# Patient Record
Sex: Female | Born: 1970 | Race: White | Hispanic: No | Marital: Married | Smoking: Former smoker
Health system: Southern US, Community
[De-identification: ages and names within clinical notes are randomized; demographics above are authoritative.]

## PROBLEM LIST (undated history)

## (undated) DIAGNOSIS — K219 Gastro-esophageal reflux disease without esophagitis: Secondary | ICD-10-CM

## (undated) DIAGNOSIS — G43909 Migraine, unspecified, not intractable, without status migrainosus: Secondary | ICD-10-CM

## (undated) HISTORY — DX: Gastro-esophageal reflux disease without esophagitis: K21.9

## (undated) HISTORY — DX: Migraine, unspecified, not intractable, without status migrainosus: G43.909

---

## 2001-11-28 HISTORY — PX: TUBAL LIGATION: SHX77

## 2007-11-19 ENCOUNTER — Ambulatory Visit: Payer: Self-pay

## 2010-03-05 ENCOUNTER — Ambulatory Visit: Payer: Self-pay | Admitting: Obstetrics and Gynecology

## 2010-03-07 ENCOUNTER — Ambulatory Visit: Payer: Self-pay | Admitting: Obstetrics and Gynecology

## 2010-12-10 IMAGING — US ULTRASOUND LEFT BREAST
1 series · 17 of 25 positions shown · non-contrast
Comparison: none

REASON FOR EXAM: SPICULATED DENSITY
COMMENTS:

[Series 1: ultrasound left breast · 17 of 40 slices shown]
[im 1/40]
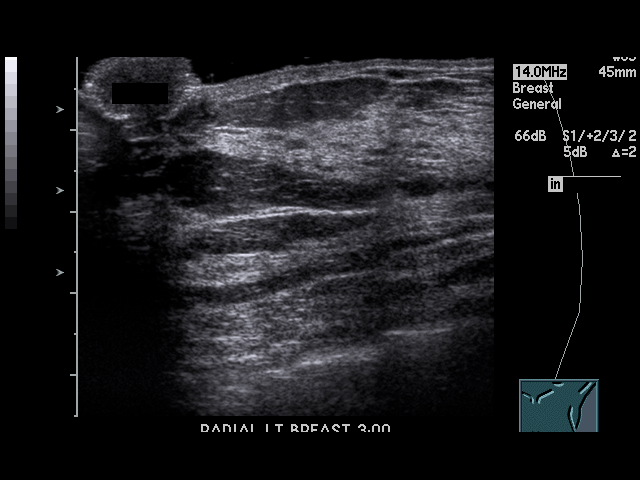
[im 4/40]
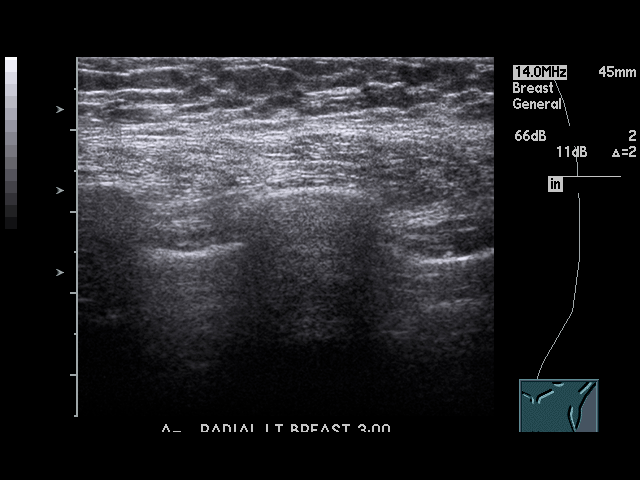
[im 5/40]
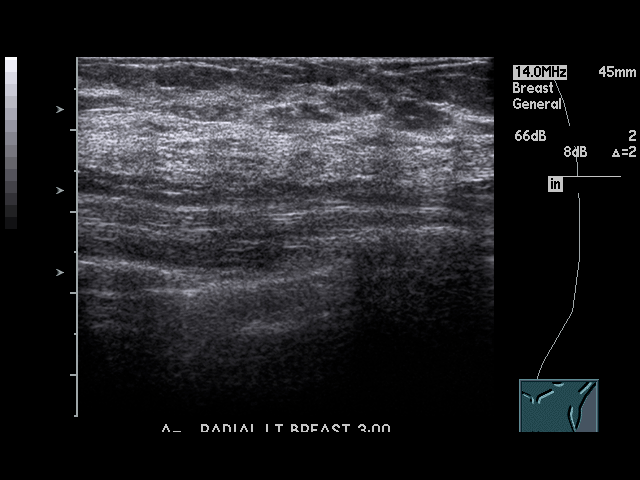
[im 9/40]
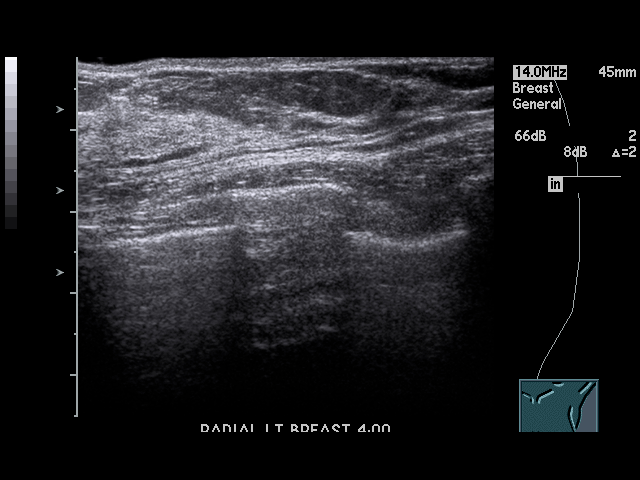
[im 10/40]
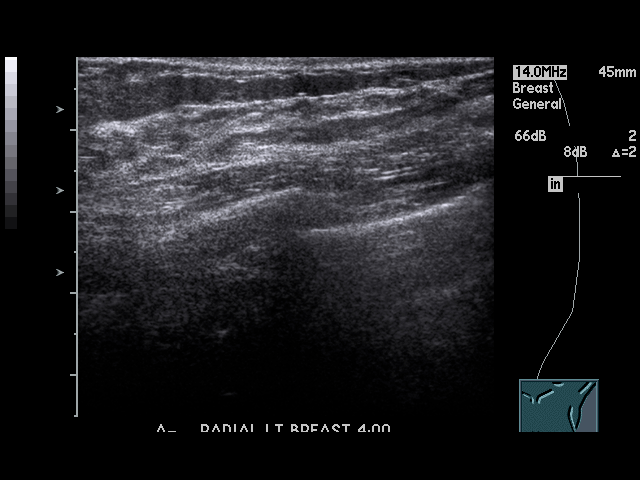
[im 14/40]
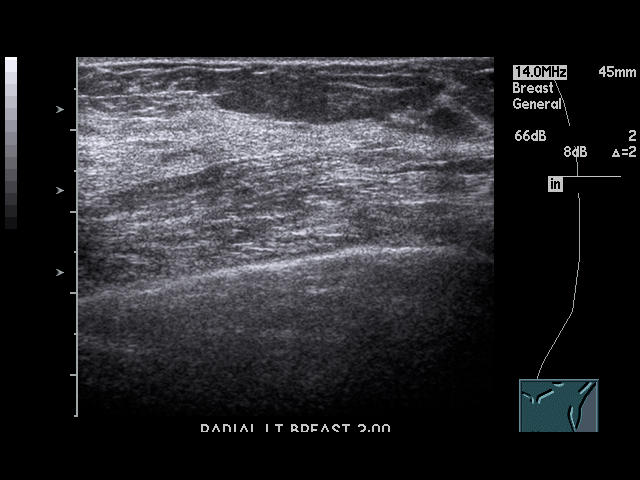
[im 15/40]
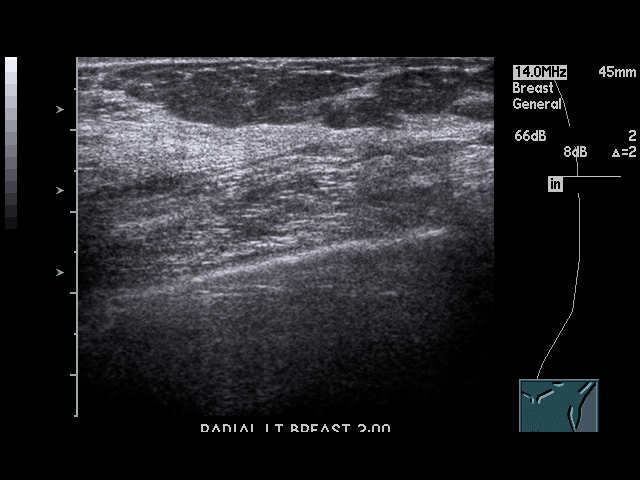
[im 18/40]
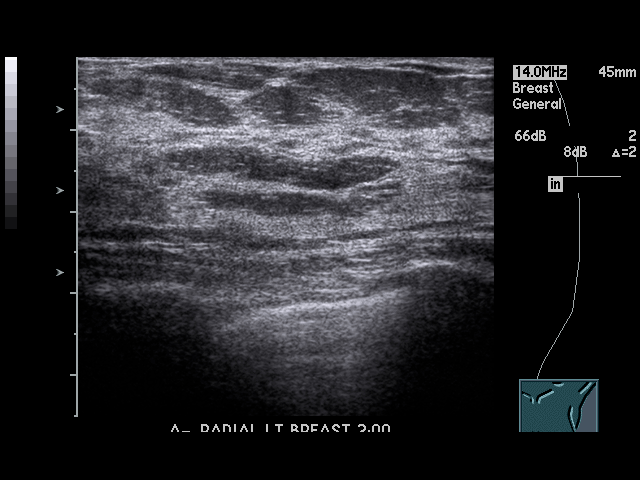
[im 20/40]
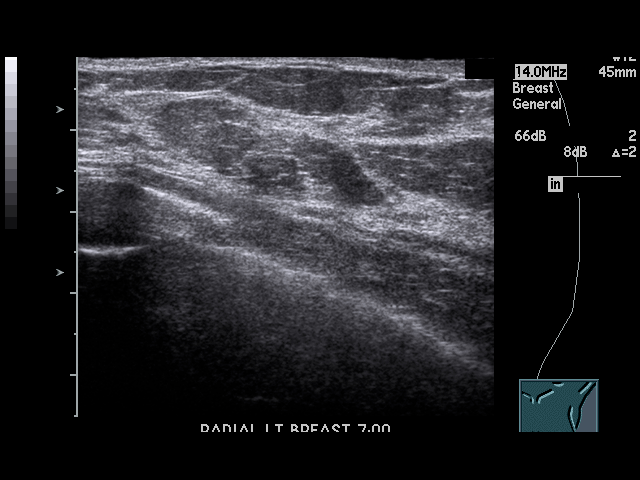
[im 22/40]
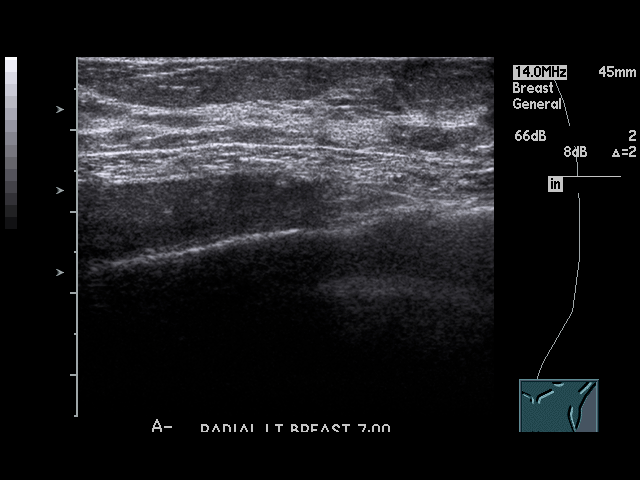
[im 25/40]
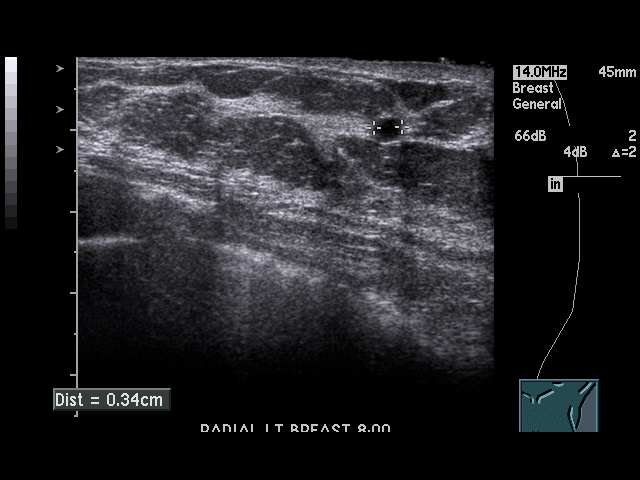
[im 27/40]
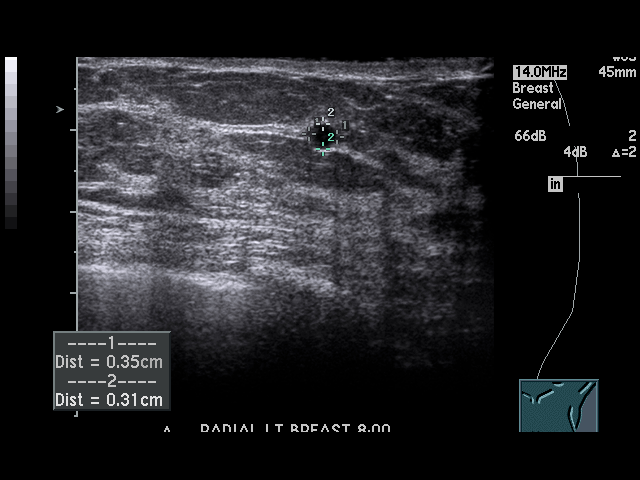
[im 30/40]
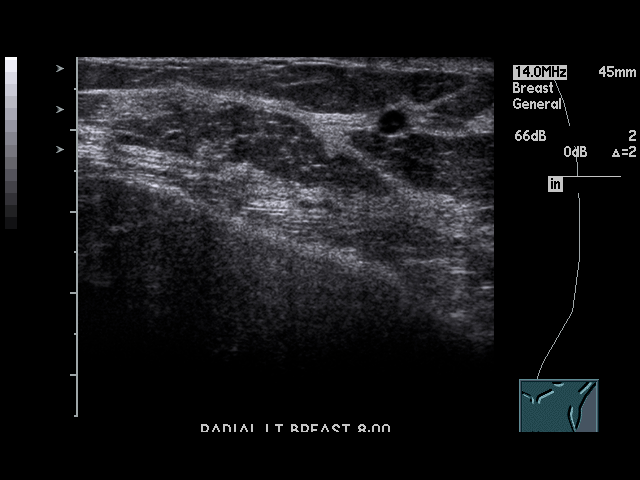
[im 31/40]
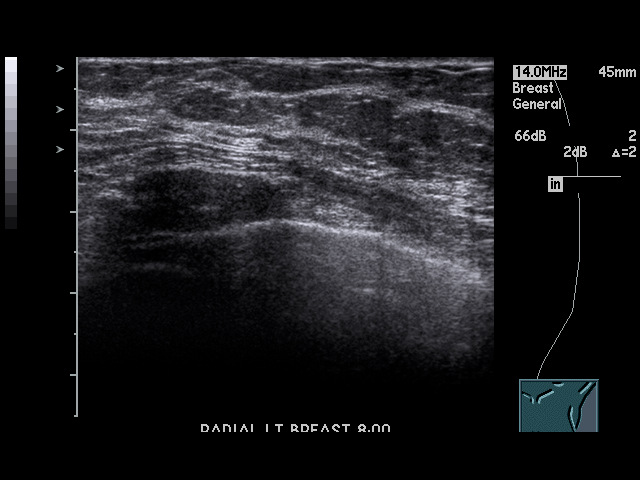
[im 35/40]
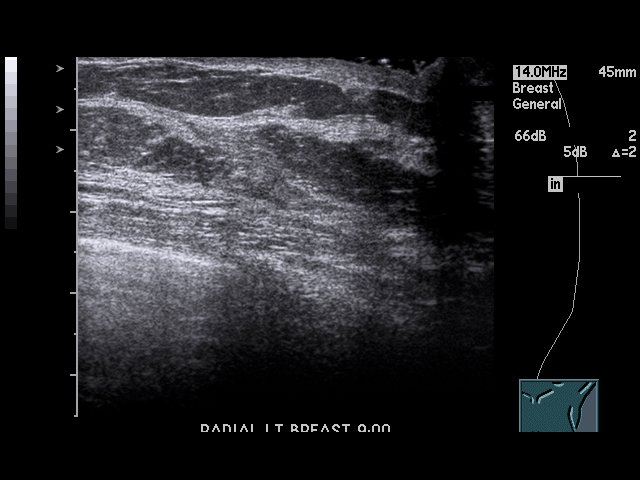
[im 36/40]
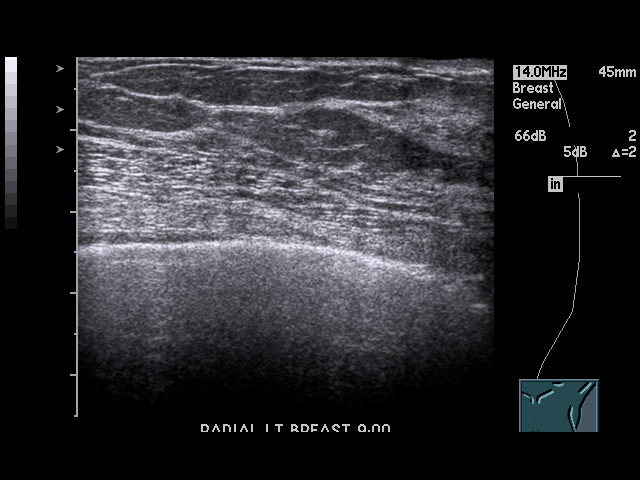
[im 40/40]
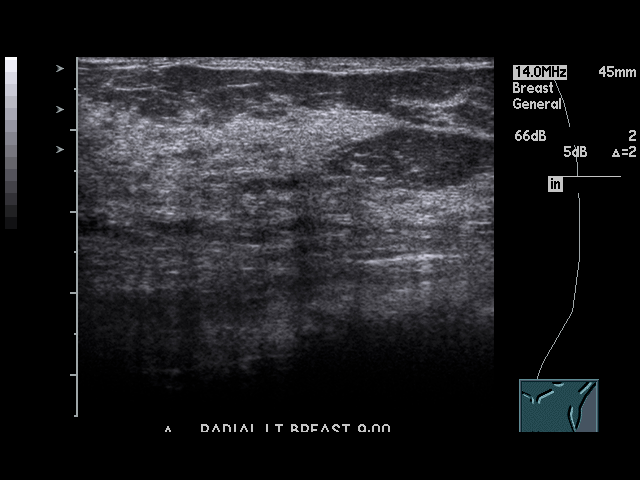

[17 of 25 positions shown; findings below may reference images not displayed]

PROCEDURE:     US  - US BREAST LEFT  - March 07, 2010  [DATE]

RESULT:     The left breast was evaluated from the 3 o'clock to the 8
o'clock position.

At the 8 o'clock position a small, benign appearing cyst is identified
measuring 3.5 x 3.1 x 3.4 mm demonstrated increased through transmission and
an imperceptible wall.  No further solid or cystic sonographic abnormality
is identified.
IMPRESSION: 1.Benign cyst at the 8 o'clock position as described above. Please refer to
the additional radiographic view dictation for completed discussion.

## 2010-12-10 IMAGING — MG MM ADDITIONAL VIEWS AT NO CHARGE
1 series · 2 of 2 positions shown · non-contrast
Comparison: none

REASON FOR EXAM: SPICULATED DENSITY
COMMENTS:

[Series 606: L ML · left · 2 of 2 slices shown]
[im 1/2]
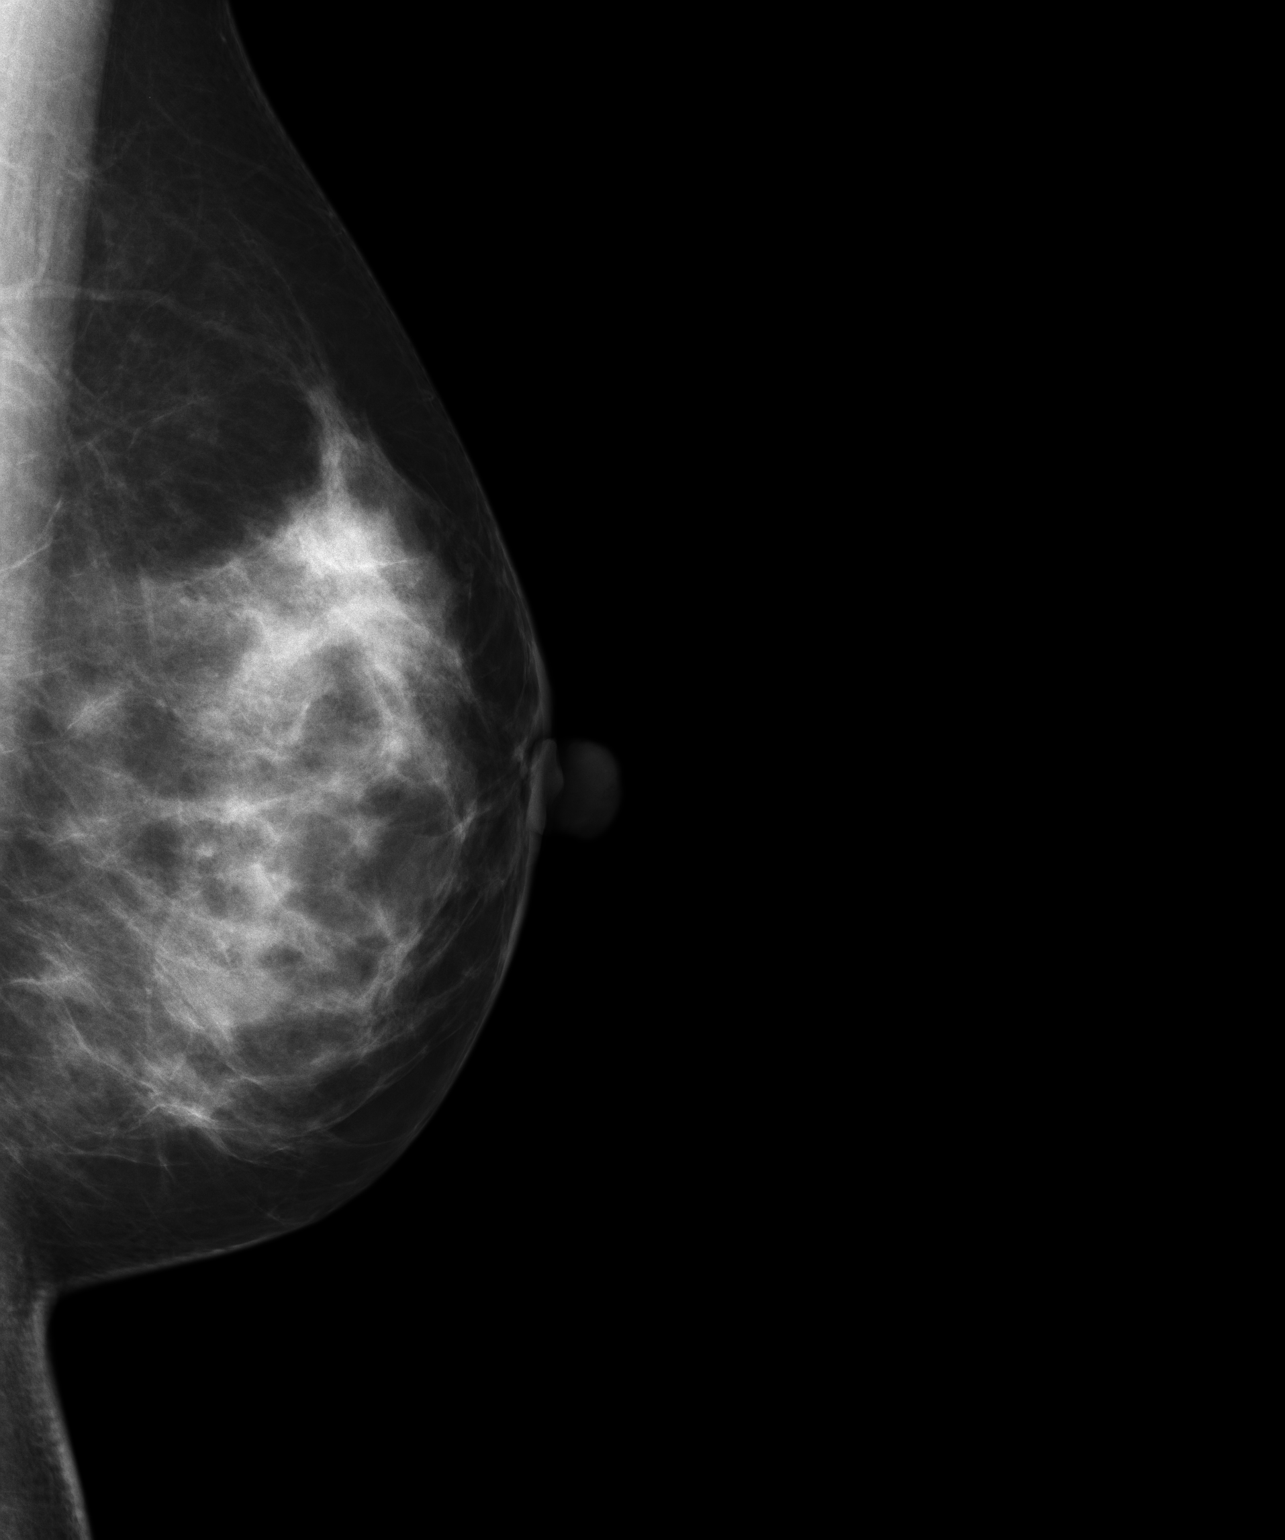
[im 2/2]
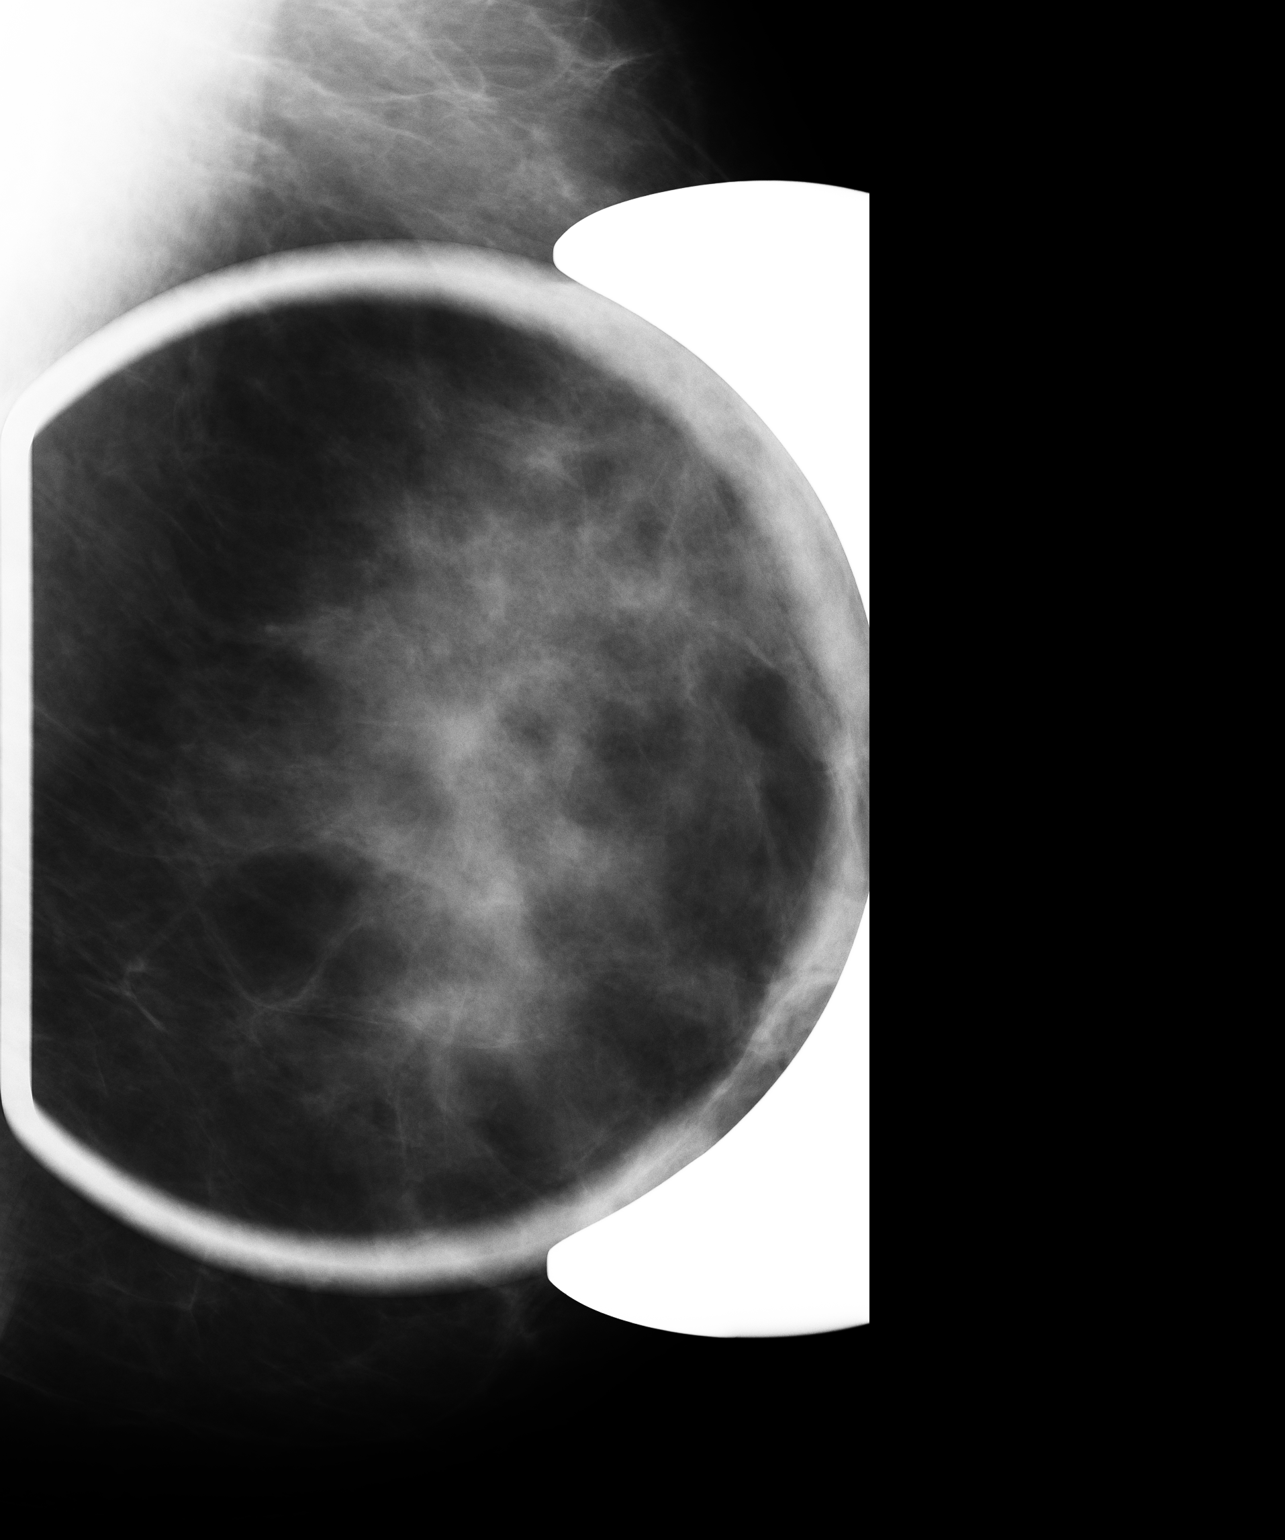

[2 of 2 positions shown; findings below may reference images not displayed]

PROCEDURE:     MAM - MAM DGTL ADD VW LT  SCR  - March 07, 2010  [DATE]

RESULT:     The previously described area of asymmetric density within the
left breast was further evaluated with magnification compression imaging.
This area is vaguely appreciated. Sonographic evaluation of this region
demonstrates a benign appearing cyst. The area is only primarily seen on the
MLO view and considering the decreased conspicuity with compression as well
as benign finding on ultrasound this area appears to correspond to a benign
finding.
IMPRESSION: BI-RADS: Category 2 - Benign Findings

A NEGATIVE MAMMOGRAM REPORT DOES NOT PRECLUDE BIOPSY OR OTHER EVALUATION OF
A CLINICALLY PALPABLE OR OTHERWISE SUSPICIOUS MASS OR LESION. BREAST CANCER
MAY NOT BE DETECTED BY MAMMOGRAPHY IN UP TO 10% OF CASES.

## 2016-05-17 DIAGNOSIS — R87610 Atypical squamous cells of undetermined significance on cytologic smear of cervix (ASC-US): Secondary | ICD-10-CM | POA: Insufficient documentation

## 2017-03-11 ENCOUNTER — Telehealth: Payer: Self-pay

## 2017-03-11 NOTE — Telephone Encounter (Signed)
Error

## 2020-05-09 ENCOUNTER — Ambulatory Visit (INDEPENDENT_AMBULATORY_CARE_PROVIDER_SITE_OTHER): Payer: Managed Care, Other (non HMO) | Admitting: Physician Assistant

## 2020-05-09 ENCOUNTER — Ambulatory Visit: Payer: Self-pay | Admitting: Family Medicine

## 2020-05-09 ENCOUNTER — Other Ambulatory Visit: Payer: Self-pay

## 2020-05-09 ENCOUNTER — Encounter: Payer: Self-pay | Admitting: Physician Assistant

## 2020-05-09 VITALS — BP 118/68 | HR 79 | Temp 97.7°F | Ht 66.5 in | Wt 166.0 lb

## 2020-05-09 DIAGNOSIS — K219 Gastro-esophageal reflux disease without esophagitis: Secondary | ICD-10-CM | POA: Insufficient documentation

## 2020-05-09 DIAGNOSIS — N926 Irregular menstruation, unspecified: Secondary | ICD-10-CM | POA: Diagnosis not present

## 2020-05-09 DIAGNOSIS — Z1231 Encounter for screening mammogram for malignant neoplasm of breast: Secondary | ICD-10-CM | POA: Insufficient documentation

## 2020-05-09 DIAGNOSIS — G43809 Other migraine, not intractable, without status migrainosus: Secondary | ICD-10-CM

## 2020-05-09 DIAGNOSIS — G43909 Migraine, unspecified, not intractable, without status migrainosus: Secondary | ICD-10-CM | POA: Insufficient documentation

## 2020-05-09 MED ORDER — UBRELVY 50 MG PO TABS
50.0000 mg | ORAL_TABLET | Freq: Every day | ORAL | 0 refills | Status: DC | PRN
Start: 2020-05-09 — End: 2021-03-02

## 2020-05-09 NOTE — Progress Notes (Signed)
New Patient Office Visit  Subjective:  Patient ID: Denise Irwin, female    DOB: 1971-04-07  Age: 49 y.o. MRN: [id number]  CC:  Chief Complaint  Patient presents with  . New to establish    Discuss Menstrual cycle issues    HPI Denise Irwin presents for abnormal menses - pt states that for the past 1-2 years she has had abnormal cycles - sometimes skipping a cycle, sometimes having a lighter flow and occasionally having hot flashes - her LMP was 05/05/20  Pt states that she has a history of ;migraines intermittently over the years - she has never been on medication in the past but states they have been more frequent in the past several months - would like to try medication to help these symptoms - has aura of flash of lights before headaches  Pt is due for mammogram - would like to schedule Also last pap 11/2017 which was normal but had no endocervical cells - due to repeat Past Medical History:  Diagnosis Date  . Gastro-esophageal reflux disease without esophagitis   . Migraines     Past Surgical History:  Procedure Laterality Date  . TUBAL LIGATION  11/28/2001    Family History  Problem Relation Age of Onset  . Osteoporosis Mother   . Diabetes Mellitus II Father   . Cancer Paternal Grandmother   . Cancer Paternal Grandfather     Social History   Socioeconomic History  . Marital status: Married    Spouse name: Not on file  . Number of children: 3  . Years of education: Not on file  . Highest education level: Not on file  Occupational History  . Occupation: Archivist  Tobacco Use  . Smoking status: Former Smoker    Types: Cigarettes    Quit date: 07/28/2016    Years since quitting: 3.7  . Smokeless tobacco: Never Used  Vaping Use  . Vaping Use: Never used  Substance and Sexual Activity  . Alcohol use: Never  . Drug use: Never  . Sexual activity: Not on file  Other Topics Concern  . Not on file  Social History Narrative  . Not on file    Social Determinants of Health   Financial Resource Strain:   . Difficulty of Paying Living Expenses:   Food Insecurity:   . Worried About Charity fundraiser in the Last Year:   . Arboriculturist in the Last Year:   Transportation Needs:   . Film/video editor (Medical):   Marland Kitchen Lack of Transportation (Non-Medical):   Physical Activity:   . Days of Exercise per Week:   . Minutes of Exercise per Session:   Stress:   . Feeling of Stress :   Social Connections:   . Frequency of Communication with Friends and Family:   . Frequency of Social Gatherings with Friends and Family:   . Attends Religious Services:   . Active Member of Clubs or Organizations:   . Attends Archivist Meetings:   Marland Kitchen Marital Status:   Intimate Partner Violence:   . Fear of Current or Ex-Partner:   . Emotionally Abused:   Marland Kitchen Physically Abused:   . Sexually Abused:      Current Outpatient Medications:  Marland Kitchen  Ubrogepant (UBRELVY) 50 MG TABS, Take 50 mg by mouth daily as needed., Disp: 10 tablet, Rfl: 0   No Known Allergies  ROS CONSTITUTIONAL: Negative for chills, fatigue, fever, unintentional weight gain and unintentional  weight loss.   CARDIOVASCULAR: Negative for chest pain, dizziness, palpitations and pedal edema.  RESPIRATORY: Negative for recent cough and dyspnea.   MSK: Negative for arthralgias and myalgias.  GU - see HPI INTEGUMENTARY: Negative for rash.  NEUROLOGICAL: see HPI PSYCHIATRIC: Negative for sleep disturbance and to question depression screen.  Negative for depression, negative for anhedonia.        Objective:    PHYSICAL EXAM:   VS: BP 118/68 (BP Location: Left Arm, Patient Position: Sitting)   Pulse 79   Temp 97.7 F (36.5 C) (Temporal)   Ht 5' 6.5" (1.689 m)   Wt 166 lb (75.3 kg)   SpO2 100%   BMI 26.39 kg/m   GEN: Well nourished, well developed, in no acute distress   Cardiac: RRR; no murmurs, rubs, or gallops,no edema -  Respiratory:  normal respiratory  rate and pattern with no distress - normal breath sounds with no rales, rhonchi, wheezes or rubs  Skin: warm and dry, no rash  Neuro:  Alert and Oriented x 3, Strength and sensation are intact - CN II-Xii grossly intact Psych: euthymic mood, appropriate affect and demeanor  BP 118/68 (BP Location: Left Arm, Patient Position: Sitting)   Pulse 79   Temp 97.7 F (36.5 C) (Temporal)   Ht 5' 6.5" (1.689 m)   Wt 166 lb (75.3 kg)   SpO2 100%   BMI 26.39 kg/m  Wt Readings from Last 3 Encounters:  05/09/20 166 lb (75.3 kg)     Health Maintenance Due  Topic Date Due  . Hepatitis C Screening  Never done  . HIV Screening  Never done  . MAMMOGRAM  Never done  . COLONOSCOPY  Never done    There are no preventive care reminders to display for this patient.  No results found for: TSH No results found for: WBC, HGB, HCT, MCV, PLT No results found for: NA, K, CHLORIDE, CO2, GLUCOSE, BUN, CREATININE, BILITOT, ALKPHOS, AST, ALT, PROT, ALBUMIN, CALCIUM, ANIONGAP, EGFR, GFR No results found for: CHOL No results found for: HDL No results found for: LDLCALC No results found for: TRIG No results found for: CHOLHDL No results found for: HGBA1C    Assessment & Plan:   Problem List Items Addressed This Visit      Cardiovascular and Mediastinum   Migraines - Primary    labwork pending Trial ubrelvy 51m      Relevant Medications   Ubrogepant (UBRELVY) 50 MG TABS   Other Relevant Orders   CBC with Differential/Platelet   Comprehensive metabolic panel   TSH     Other   Abnormal menses   Relevant Orders   CBC with Differential/Platelet   Comprehensive metabolic panel   TSH   Encounter for screening mammogram for breast cancer   Relevant Orders   MM Digital Screening      Meds ordered this encounter  Medications  . Ubrogepant (UBRELVY) 50 MG TABS    Sig: Take 50 mg by mouth daily as needed.    Dispense:  10 tablet    Refill:  0    Order Specific Question:   Supervising  Provider    Answer:   CShelton Silvas   Follow-up: Return in about 3 months (around 08/09/2020) for for physical/pap.    SARA R DAVIS, PA-C

## 2020-05-09 NOTE — Assessment & Plan Note (Signed)
labwork pending Trial ubrelvy 50mg 

## 2020-05-10 ENCOUNTER — Other Ambulatory Visit: Payer: Self-pay | Admitting: Physician Assistant

## 2020-05-10 DIAGNOSIS — R799 Abnormal finding of blood chemistry, unspecified: Secondary | ICD-10-CM

## 2020-05-10 LAB — CBC WITH DIFFERENTIAL/PLATELET
Basophils Absolute: 0.1 10*3/uL (ref 0.0–0.2)
Basos: 1 %
EOS (ABSOLUTE): 0.2 10*3/uL (ref 0.0–0.4)
Eos: 3 %
Hematocrit: 40.2 % (ref 34.0–46.6)
Hemoglobin: 13.2 g/dL (ref 11.1–15.9)
Immature Grans (Abs): 0 10*3/uL (ref 0.0–0.1)
Immature Granulocytes: 1 %
Lymphocytes Absolute: 1.5 10*3/uL (ref 0.7–3.1)
Lymphs: 28 %
MCH: 31.2 pg (ref 26.6–33.0)
MCHC: 32.8 g/dL (ref 31.5–35.7)
MCV: 95 fL (ref 79–97)
Monocytes Absolute: 0.4 10*3/uL (ref 0.1–0.9)
Monocytes: 7 %
Neutrophils Absolute: 3.3 10*3/uL (ref 1.4–7.0)
Neutrophils: 60 %
Platelets: 233 10*3/uL (ref 150–450)
RBC: 4.23 x10E6/uL (ref 3.77–5.28)
RDW: 12.3 % (ref 11.7–15.4)
WBC: 5.5 10*3/uL (ref 3.4–10.8)

## 2020-05-10 LAB — COMPREHENSIVE METABOLIC PANEL
ALT: 14 IU/L (ref 0–32)
AST: 9 IU/L (ref 0–40)
Albumin/Globulin Ratio: 2.4 — ABNORMAL HIGH (ref 1.2–2.2)
Albumin: 4.6 g/dL (ref 3.8–4.8)
Alkaline Phosphatase: 78 IU/L (ref 48–121)
BUN/Creatinine Ratio: 15 (ref 9–23)
BUN: 11 mg/dL (ref 6–24)
Bilirubin Total: <0.2 mg/dL (ref 0.0–1.2)
CO2: 25 mmol/L (ref 20–29)
Calcium: 10 mg/dL (ref 8.7–10.2)
Chloride: 103 mmol/L (ref 96–106)
Creatinine, Ser: 0.75 mg/dL (ref 0.57–1.00)
GFR calc Af Amer: 107 mL/min/{1.73_m2} (ref >59)
GFR calc non Af Amer: 93 mL/min/{1.73_m2} (ref >59)
Globulin, Total: 1.9 g/dL (ref 1.5–4.5)
Glucose: 94 mg/dL (ref 65–99)
Potassium: 5 mmol/L (ref 3.5–5.2)
Sodium: 138 mmol/L (ref 134–144)
Total Protein: 6.5 g/dL (ref 6.0–8.5)

## 2020-05-10 LAB — TSH: TSH: 0.395 u[IU]/mL — ABNORMAL LOW (ref 0.450–4.500)

## 2020-06-05 ENCOUNTER — Encounter: Payer: Self-pay | Admitting: Physician Assistant

## 2020-06-08 ENCOUNTER — Ambulatory Visit (INDEPENDENT_AMBULATORY_CARE_PROVIDER_SITE_OTHER): Payer: Managed Care, Other (non HMO) | Admitting: Family Medicine

## 2020-06-08 ENCOUNTER — Other Ambulatory Visit: Payer: Self-pay

## 2020-06-08 DIAGNOSIS — R799 Abnormal finding of blood chemistry, unspecified: Secondary | ICD-10-CM

## 2020-06-09 LAB — THYROID PANEL WITH TSH
Free Thyroxine Index: 1.6 (ref 1.2–4.9)
T3 Uptake Ratio: 25 % (ref 24–39)
T4, Total: 6.3 ug/dL (ref 4.5–12.0)
TSH: 0.5 u[IU]/mL (ref 0.450–4.500)

## 2020-06-12 ENCOUNTER — Encounter: Payer: Self-pay | Admitting: Physician Assistant

## 2020-06-23 ENCOUNTER — Other Ambulatory Visit: Payer: Self-pay

## 2020-06-23 DIAGNOSIS — Z1231 Encounter for screening mammogram for malignant neoplasm of breast: Secondary | ICD-10-CM

## 2020-07-12 ENCOUNTER — Other Ambulatory Visit: Payer: Self-pay | Admitting: Physician Assistant

## 2020-08-02 ENCOUNTER — Other Ambulatory Visit: Payer: Self-pay | Admitting: Physician Assistant

## 2020-08-02 DIAGNOSIS — Z1231 Encounter for screening mammogram for malignant neoplasm of breast: Secondary | ICD-10-CM

## 2020-08-17 ENCOUNTER — Other Ambulatory Visit: Payer: Self-pay

## 2020-08-17 ENCOUNTER — Ambulatory Visit (INDEPENDENT_AMBULATORY_CARE_PROVIDER_SITE_OTHER): Payer: Managed Care, Other (non HMO) | Admitting: Family Medicine

## 2020-08-17 VITALS — BP 106/60 | HR 80 | Temp 97.3°F | Resp 18 | Ht 67.0 in | Wt 159.6 lb

## 2020-08-17 DIAGNOSIS — N921 Excessive and frequent menstruation with irregular cycle: Secondary | ICD-10-CM

## 2020-08-17 DIAGNOSIS — Z0001 Encounter for general adult medical examination with abnormal findings: Secondary | ICD-10-CM

## 2020-08-17 DIAGNOSIS — Z1231 Encounter for screening mammogram for malignant neoplasm of breast: Secondary | ICD-10-CM

## 2020-08-17 DIAGNOSIS — Z124 Encounter for screening for malignant neoplasm of cervix: Secondary | ICD-10-CM

## 2020-08-17 DIAGNOSIS — R7989 Other specified abnormal findings of blood chemistry: Secondary | ICD-10-CM

## 2020-08-17 DIAGNOSIS — Z1211 Encounter for screening for malignant neoplasm of colon: Secondary | ICD-10-CM

## 2020-08-17 MED ORDER — MEDROXYPROGESTERONE ACETATE 10 MG PO TABS: 10.0000 mg | ORAL_TABLET | Freq: Every day | ORAL | 0 refills | Status: DC

## 2020-08-17 MED ORDER — SUMATRIPTAN SUCCINATE 50 MG PO TABS: 50.0000 mg | ORAL_TABLET | ORAL | 0 refills | Status: AC | PRN

## 2020-08-17 NOTE — Progress Notes (Signed)
Subjective:  Patient ID: Denise Irwin, female    DOB: [birth year]  Age: 49 y.o. MRN: [id number]  Chief Complaint  Patient presents with   Annual Exam    HPI Encounter for general adult medical examination without abnormal findings  Physical ("At Risk" items are starred): Patient's last physical exam was 1 year ago .  Smoking: Former smoker.  E cigarettes 2 to 3/day Physical Activity: Does not exercise Alcohol/Drug Use: Is a non-drinker ; No illicit drug use ;  Patient is not afflicted from Stress Incontinence and Urge Incontinence  Safety: reviewed ; Patient wears a seat belt, has functional smoke detectors, has functional carbon monoxide detectors, practices appropriate gun safety, and wears sunscreen with extended sun exposure. Dental Care: biannual cleanings, brushes and flosses daily. Ophthalmology/Optometry: Annual visit.  Dr. Precious Bard Hearing loss: none Vision impairments: Reading glasses Due for mammogram.  The last one was 2011. Last Pap smear 2019. 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 Last menstrual period July 09, 2020.  Patient had no menses for 7 months.  She then had a normal menses in April, May, July, August and September.  She skipped June.  No flowsheet data found.   Depression screen PHQ 2/9 08/27/2020  Decreased Interest 0  Down, Depressed, Hopeless 0  PHQ - 2 Score 0      Social Hx   Social History   Socioeconomic History   Marital status: Married    Spouse name: Not on file   Number of children: 3   Years of education: Not on file   Highest education level: Not on file  Occupational History   Occupation: Occupational hygienist  Tobacco Use   Smoking status: Former Smoker    Types: Cigarettes    Quit date: 07/28/2016    Years since quitting: 4.0   Smokeless tobacco: Never Used  Vaping Use   Vaping Use: Never used  Substance and Sexual Activity   Alcohol use: Never   Drug use: Never   Sexual  activity: Not on file  Other Topics Concern   Not on file  Social History Narrative   Not on file   Social Determinants of Health   Financial Resource Strain:    Difficulty of Paying Living Expenses: Not on file  Food Insecurity:    Worried About Programme researcher, broadcasting/film/video in the Last Year: Not on file   [employer] of Food in the Last Year: Not on file  Transportation Needs:    Lack of Transportation (Medical): Not on file   Lack of Transportation (Non-Medical): Not on file  Physical Activity: Inactive   Days of Exercise per Week: 0 days   Minutes of Exercise per Session: 0 min  Stress:    Feeling of Stress : Not on file  Social Connections:    Frequency of Communication with Friends and Family: Not on file   Frequency of Social Gatherings with Friends and Family: Not on file   Attends Religious Services: Not on file   Active Member of Clubs or Organizations: Not on file   Attends Banker Meetings: Not on file   Marital Status: Not on file   Past Medical History:  Diagnosis Date   Gastro-esophageal reflux disease without esophagitis    Migraines    Family History  Problem Relation Age of Onset   Osteoporosis Mother    Diabetes Mellitus II Father    Cancer Paternal Grandmother    Cancer Paternal Grandfather  Current Outpatient Medications on File Prior to Visit  Medication Sig Dispense Refill   Multiple Vitamin (MULTIVITAMIN) tablet Take 1 tablet by mouth daily.     Ubrogepant (UBRELVY) 50 MG TABS Take 50 mg by mouth daily as needed. 10 tablet 0   No current facility-administered medications on file prior to visit.   Review of Systems  Constitutional: Negative for chills, fatigue and fever.  HENT: Positive for hearing loss and tinnitus (Right ear). Negative for congestion, ear pain and sore throat.   Respiratory: Negative for cough and shortness of breath.   Cardiovascular: Negative for chest pain.  Gastrointestinal: Negative for  abdominal pain, constipation, diarrhea, nausea and vomiting.  Genitourinary: Positive for menstrual problem. Negative for dysuria and urgency.       Decreased sex drive  Musculoskeletal: Negative for arthralgias and myalgias.  Skin: Negative for rash.  Neurological: Positive for headaches (migraine: Bernita Raisin worked but it was too expensive.  It was not covered by her insurance.  She has not tried a triptan previously.). Negative for dizziness.  Psychiatric/Behavioral: Negative for dysphoric mood. The patient is not nervous/anxious.      Objective:  BP 106/60    Pulse 80    Temp (!) 97.3 F (36.3 C)    Resp 18    Ht 5\' 7"  (1.702 m)    Wt 159 lb 9.6 oz (72.4 kg)    BMI 25.00 kg/m   BP/Weight 08/17/2020 05/09/2020  Systolic BP 106 118  Diastolic BP 60 68  Wt. (Lbs) 159.6 166  BMI 25 26.39    Physical Exam Vitals reviewed.  Constitutional:      General: She is not in acute distress.    Appearance: Normal appearance. She is normal weight.  HENT:     Right Ear: Tympanic membrane, ear canal and external ear normal.     Left Ear: Tympanic membrane, ear canal and external ear normal.     Nose: Nose normal. No congestion or rhinorrhea.     Mouth/Throat:     Mouth: Mucous membranes are moist.     Pharynx: No oropharyngeal exudate or posterior oropharyngeal erythema.  Eyes:     Conjunctiva/sclera: Conjunctivae normal.  Neck:     Thyroid: No thyroid mass.     Vascular: No carotid bruit.  Cardiovascular:     Rate and Rhythm: Normal rate and regular rhythm.     Pulses: Normal pulses.     Heart sounds: No murmur heard.   Pulmonary:     Effort: Pulmonary effort is normal.     Breath sounds: Normal breath sounds.  Chest:     Breasts:        Right: Normal.        Left: Normal.  Abdominal:     General: Bowel sounds are normal.     Palpations: Abdomen is soft. There is no mass.     Tenderness: There is no abdominal tenderness.  Genitourinary:    General: Normal vulva.     Vagina: No  vaginal discharge.     Comments: Cervix appears normal.  Pap smear taken. Lymphadenopathy:     Cervical: No cervical adenopathy.  Skin:    General: Skin is warm and dry.     Findings: No lesion or rash.  Neurological:     Mental Status: She is alert and oriented to person, place, and time.     Cranial Nerves: No cranial nerve deficit.  Psychiatric:        Mood and Affect: Mood  normal.        Behavior: Behavior normal.     Lab Results  Component Value Date   WBC 5.3 08/17/2020   HGB 14.2 08/17/2020   HCT 40.8 08/17/2020   PLT 284 08/17/2020   GLUCOSE 79 08/17/2020   CHOL 213 (H) 08/17/2020   TRIG 62 08/17/2020   HDL 57 08/17/2020   LDLCALC 145 (H) 08/17/2020   ALT 14 08/17/2020   AST 14 08/17/2020   NA 143 08/17/2020   K 5.4 (H) 08/17/2020   CL 104 08/17/2020   CREATININE 0.70 08/17/2020   BUN 7 08/17/2020   CO2 26 08/17/2020   TSH 0.359 (L) 08/17/2020   HGBA1C 5.3 08/17/2020      Assessment & Plan:  1. Abnormal physical evaluation - CBC with Differential/Platelet - Comprehensive metabolic panel - Hemoglobin A1c - Lipid panel - TSH - Cardiovascular Risk Assessment  2. Cervical cancer screening - IGP, Aptima HPV, rfx 16/18,45  3. Menorrhagia with irregular cycle Given provera 10 mg once daily for 10 days.  If continued issues, call and will order Ultrasound. - medroxyPROGESTERone (PROVERA) 10 MG tablet; Take 1 tablet (10 mg total) by mouth daily.  Dispense: 10 tablet; Refill: 0   4.  Breast cancer screening Schedule for mobile mammogram here at our office.  5.  Colon cancer screening Order Cologuard test.  Patient prefers not to get a colonoscopy.  6.  Migraines: Prescription for Imitrex sent  Meds ordered this encounter  Medications   medroxyPROGESTERone (PROVERA) 10 MG tablet    Sig: Take 1 tablet (10 mg total) by mouth daily.    Dispense:  10 tablet    Refill:  0   SUMAtriptan (IMITREX) 50 MG tablet    Sig: Take 1 tablet (50 mg total) by  mouth every 2 (two) hours as needed for migraine. May repeat in 2 hours if headache persists or recurs.    Dispense:  10 tablet    Refill:  0    These are the goals we discussed: Goals   Patient work on eating healthier and exercising. Get mammogram and Cologuard. Recommend Calcium with vitamin D 1200 mg daily Patient plans to get her flu shot at the health department, as well as her booster for the Covid.     This is a list of the screening recommended for you and due dates:  Health Maintenance  Topic Date Due    Hepatitis C: One time screening is recommended by Center for Disease Control  (CDC) for  adults born from [birth year] through 1966.   Never done   HIV Screening  Never done   Mammogram  Never done   Colon Cancer Screening  Never done   Flu Shot  05/28/2020   Pap Smear  08/18/2023   Tetanus Vaccine  06/13/2029   COVID-19 Vaccine  Completed     AN INDIVIDUALIZED CARE PLAN: was established or reinforced today.   SELF MANAGEMENT: The patient and I together assessed ways to personally work towards obtaining the recommended goals  Support needs The patient and/or family needs were assessed and services were offered and not necessary at this time.    Follow-up: No follow-ups on file. Bl[facility] [phone]

## 2020-08-17 NOTE — Patient Instructions (Signed)
Migraines: rx for imitrex sent. Irregular menses: provera 10 mg once daily for 10 days.  If continued issues, call and will order Ultrasound. Eye doctor: Dr. Renaldo Fiddler. Ordering cologuard.  Recommend Calcium with vitamin D 1200 mg daily  Preventive Care 35-50 Years Old, Female Preventive care refers to visits with your health care provider and lifestyle choices that can promote health and wellness. This includes:  A yearly physical exam. This may also be called an annual well check.  Regular dental visits and eye exams.  Immunizations.  Screening for certain conditions.  Healthy lifestyle choices, such as eating a healthy diet, getting regular exercise, not using drugs or products that contain nicotine and tobacco, and limiting alcohol use. What can I expect for my preventive care visit? Physical exam Your health care provider will check your:  Height and weight. This may be used to calculate body mass index (BMI), which tells if you are at a healthy weight.  Heart rate and blood pressure.  Skin for abnormal spots. Counseling Your health care provider may ask you questions about your:  Alcohol, tobacco, and drug use.  Emotional well-being.  Home and relationship well-being.  Sexual activity.  Eating habits.  Work and work Statistician.  Method of birth control.  Menstrual cycle.  Pregnancy history. What immunizations do I need?  Influenza (flu) vaccine  This is recommended every year. Tetanus, diphtheria, and pertussis (Tdap) vaccine  You may need a Td booster every 10 years. Varicella (chickenpox) vaccine  You may need this if you have not been vaccinated. Zoster (shingles) vaccine  You may need this after age 2. Measles, mumps, and rubella (MMR) vaccine  You may need at least one dose of MMR if you were born in 1957 or later. You may also need a second dose. Pneumococcal conjugate (PCV13) vaccine  You may need this if you have certain conditions and  were not previously vaccinated. Pneumococcal polysaccharide (PPSV23) vaccine  You may need one or two doses if you smoke cigarettes or if you have certain conditions. Meningococcal conjugate (MenACWY) vaccine  You may need this if you have certain conditions. Hepatitis A vaccine  You may need this if you have certain conditions or if you travel or work in places where you may be exposed to hepatitis A. Hepatitis B vaccine  You may need this if you have certain conditions or if you travel or work in places where you may be exposed to hepatitis B. Haemophilus influenzae type b (Hib) vaccine  You may need this if you have certain conditions. Human papillomavirus (HPV) vaccine  If recommended by your health care provider, you may need three doses over 6 months. You may receive vaccines as individual doses or as more than one vaccine together in one shot (combination vaccines). Talk with your health care provider about the risks and benefits of combination vaccines. What tests do I need? Blood tests  Lipid and cholesterol levels. These may be checked every 5 years, or more frequently if you are over 20 years old.  Hepatitis C test.  Hepatitis B test. Screening  Lung cancer screening. You may have this screening every year starting at age 24 if you have a 30-pack-year history of smoking and currently smoke or have quit within the past 15 years.  Colorectal cancer screening. All adults should have this screening starting at age 76 and continuing until age 5. Your health care provider may recommend screening at age 73 if you are at increased risk. You will  have tests every 1-10 years, depending on your results and the type of screening test.  Diabetes screening. This is done by checking your blood sugar (glucose) after you have not eaten for a while (fasting). You may have this done every 1-3 years.  Mammogram. This may be done every 1-2 years. Talk with your health care provider about  when you should start having regular mammograms. This may depend on whether you have a family history of breast cancer.  BRCA-related cancer screening. This may be done if you have a family history of breast, ovarian, tubal, or peritoneal cancers.  Pelvic exam and Pap test. This may be done every 3 years starting at age 64. Starting at age 71, this may be done every 5 years if you have a Pap test in combination with an HPV test. Other tests  Sexually transmitted disease (STD) testing.  Bone density scan. This is done to screen for osteoporosis. You may have this scan if you are at high risk for osteoporosis. Follow these instructions at home: Eating and drinking  Eat a diet that includes fresh fruits and vegetables, whole grains, lean protein, and low-fat dairy.  Take vitamin and mineral supplements as recommended by your health care provider.  Do not drink alcohol if: ? Your health care provider tells you not to drink. ? You are pregnant, may be pregnant, or are planning to become pregnant.  If you drink alcohol: ? Limit how much you have to 0-1 drink a day. ? Be aware of how much alcohol is in your drink. In the [place] one drink equals one 12 oz bottle of beer (355 mL), one 5 oz glass of wine (148 mL), or one 1 oz glass of hard liquor (44 mL). Lifestyle  Take daily care of your teeth and gums.  Stay active. Exercise for at least 30 minutes on 5 or more days each week.  Do not use any products that contain nicotine or tobacco, such as cigarettes, e-cigarettes, and chewing tobacco. If you need help quitting, ask your health care provider.  If you are sexually active, practice safe sex. Use a condom or other form of birth control (contraception) in order to prevent pregnancy and STIs (sexually transmitted infections).  If told by your health care provider, take low-dose aspirin daily starting at age 71. What's next?  Visit your health care provider once a year for a well check  visit.  Ask your health care provider how often you should have your eyes and teeth checked.  Stay up to date on all vaccines. This information is not intended to replace advice given to you by your health care provider. Make sure you discuss any questions you have with your health care provider. Document Revised: 06/25/2018 Document Reviewed: 06/25/2018 Elsevier Patient Education  2020 [employer].

## 2020-08-18 LAB — COMPREHENSIVE METABOLIC PANEL
ALT: 14 IU/L (ref 0–32)
AST: 14 IU/L (ref 0–40)
Albumin/Globulin Ratio: 2.3 — ABNORMAL HIGH (ref 1.2–2.2)
Albumin: 5 g/dL — ABNORMAL HIGH (ref 3.8–4.8)
Alkaline Phosphatase: 72 IU/L (ref 44–121)
BUN/Creatinine Ratio: 10 (ref 9–23)
BUN: 7 mg/dL (ref 6–24)
Bilirubin Total: 0.4 mg/dL (ref 0.0–1.2)
CO2: 26 mmol/L (ref 20–29)
Calcium: 10 mg/dL (ref 8.7–10.2)
Chloride: 104 mmol/L (ref 96–106)
Creatinine, Ser: 0.7 mg/dL (ref 0.57–1.00)
GFR calc Af Amer: 117 mL/min/{1.73_m2} (ref >59)
GFR calc non Af Amer: 101 mL/min/{1.73_m2} (ref >59)
Globulin, Total: 2.2 g/dL (ref 1.5–4.5)
Glucose: 79 mg/dL (ref 65–99)
Potassium: 5.4 mmol/L — ABNORMAL HIGH (ref 3.5–5.2)
Sodium: 143 mmol/L (ref 134–144)
Total Protein: 7.2 g/dL (ref 6.0–8.5)

## 2020-08-18 LAB — LIPID PANEL
Chol/HDL Ratio: 3.7 ratio (ref 0.0–4.4)
Cholesterol, Total: 213 mg/dL — ABNORMAL HIGH (ref 100–199)
HDL: 57 mg/dL (ref >39)
LDL Chol Calc (NIH): 145 mg/dL — ABNORMAL HIGH (ref 0–99)
Triglycerides: 62 mg/dL (ref 0–149)
VLDL Cholesterol Cal: 11 mg/dL (ref 5–40)

## 2020-08-18 LAB — CBC WITH DIFFERENTIAL/PLATELET
Basophils Absolute: 0.1 10*3/uL (ref 0.0–0.2)
Basos: 1 %
EOS (ABSOLUTE): 0.2 10*3/uL (ref 0.0–0.4)
Eos: 3 %
Hematocrit: 40.8 % (ref 34.0–46.6)
Hemoglobin: 14.2 g/dL (ref 11.1–15.9)
Immature Grans (Abs): 0 10*3/uL (ref 0.0–0.1)
Immature Granulocytes: 0 %
Lymphocytes Absolute: 1.7 10*3/uL (ref 0.7–3.1)
Lymphs: 32 %
MCH: 31.1 pg (ref 26.6–33.0)
MCHC: 34.8 g/dL (ref 31.5–35.7)
MCV: 90 fL (ref 79–97)
Monocytes Absolute: 0.3 10*3/uL (ref 0.1–0.9)
Monocytes: 6 %
Neutrophils Absolute: 3.1 10*3/uL (ref 1.4–7.0)
Neutrophils: 58 %
Platelets: 284 10*3/uL (ref 150–450)
RBC: 4.56 x10E6/uL (ref 3.77–5.28)
RDW: 11.8 % (ref 11.7–15.4)
WBC: 5.3 10*3/uL (ref 3.4–10.8)

## 2020-08-18 LAB — HEMOGLOBIN A1C
Est. average glucose Bld gHb Est-mCnc: 105 mg/dL
Hgb A1c MFr Bld: 5.3 % (ref 4.8–5.6)

## 2020-08-18 LAB — TSH: TSH: 0.359 u[IU]/mL — ABNORMAL LOW (ref 0.450–4.500)

## 2020-08-18 LAB — CARDIOVASCULAR RISK ASSESSMENT

## 2020-08-21 ENCOUNTER — Telehealth: Payer: Self-pay

## 2020-08-22 NOTE — Telephone Encounter (Signed)
error 

## 2020-08-23 LAB — IGP, APTIMA HPV, RFX 16/18,45
HPV Aptima: NEGATIVE
PAP Smear Comment: 0

## 2020-08-24 LAB — T4, FREE: Free T4: 1.31 ng/dL (ref 0.82–1.77)

## 2020-08-24 LAB — SPECIMEN STATUS REPORT

## 2020-08-24 LAB — T3 UPTAKE: T3 Uptake Ratio: 28 % (ref 24–39)

## 2020-08-27 ENCOUNTER — Encounter: Payer: Self-pay | Admitting: Family Medicine

## 2021-01-26 ENCOUNTER — Encounter: Payer: Self-pay | Admitting: Family Medicine

## 2021-02-03 ENCOUNTER — Other Ambulatory Visit: Payer: Self-pay | Admitting: Family Medicine

## 2021-02-03 DIAGNOSIS — Z1211 Encounter for screening for malignant neoplasm of colon: Secondary | ICD-10-CM

## 2021-02-11 LAB — COLOGUARD: Cologuard: NEGATIVE

## 2021-02-21 ENCOUNTER — Ambulatory Visit: Payer: Managed Care, Other (non HMO) | Admitting: Family Medicine

## 2021-03-02 ENCOUNTER — Ambulatory Visit (INDEPENDENT_AMBULATORY_CARE_PROVIDER_SITE_OTHER): Payer: Managed Care, Other (non HMO) | Admitting: Family Medicine

## 2021-03-02 ENCOUNTER — Other Ambulatory Visit: Payer: Self-pay

## 2021-03-02 VITALS — BP 104/64 | HR 88 | Temp 97.5°F | Ht 66.0 in | Wt 148.0 lb

## 2021-03-02 DIAGNOSIS — N951 Menopausal and female climacteric states: Secondary | ICD-10-CM

## 2021-03-02 DIAGNOSIS — R7989 Other specified abnormal findings of blood chemistry: Secondary | ICD-10-CM

## 2021-03-02 DIAGNOSIS — E782 Mixed hyperlipidemia: Secondary | ICD-10-CM

## 2021-03-02 NOTE — Progress Notes (Signed)
Acute Office Visit  Subjective:    Patient ID: Denise Irwin, female    DOB: [birth year], 50 y.o.   MRN: [id number]  Chief Complaint  Patient presents with  . Hot Flashes  . Insomnia    HPI Patient is in today for menopausal symptoms and follow up. Pt prefers not to take a rx medicine (HRT, SSRIs.)  Pt is experiencing hot flashes, lack of sleep. Denies depression.  Past Medical History:  Diagnosis Date  . Gastro-esophageal reflux disease without esophagitis   . Migraines     Past Surgical History:  Procedure Laterality Date  . TUBAL LIGATION  11/28/2001    Family History  Problem Relation Age of Onset  . Osteoporosis Mother   . Diabetes Mellitus II Father   . Cancer Paternal Grandmother   . Cancer Paternal Grandfather     Social History   Socioeconomic History  . Marital status: Married    Spouse name: Not on file  . Number of children: 3  . Years of education: Not on file  . Highest education level: Not on file  Occupational History  . Occupation: Archivist  Tobacco Use  . Smoking status: Former Smoker    Types: Cigarettes    Quit date: 07/28/2016    Years since quitting: 4.6  . Smokeless tobacco: Never Used  Vaping Use  . Vaping Use: Never used  Substance and Sexual Activity  . Alcohol use: Never  . Drug use: Never  . Sexual activity: Not on file  Other Topics Concern  . Not on file  Social History Narrative  . Not on file   Social Determinants of Health   Financial Resource Strain: Not on file  Food Insecurity: Not on file  Transportation Needs: Not on file  Physical Activity: Inactive  . Days of Exercise per Week: 0 days  . Minutes of Exercise per Session: 0 min  Stress: Not on file  Social Connections: Not on file  Intimate Partner Violence: Unknown  . Fear of Current or Ex-Partner: No  . Emotionally Abused: Not on file  . Physically Abused: Not on file  . Sexually Abused: Not on file    Outpatient Medications Prior to  Visit  Medication Sig Dispense Refill  . SUMAtriptan (IMITREX) 50 MG tablet Take 1 tablet (50 mg total) by mouth every 2 (two) hours as needed for migraine. May repeat in 2 hours if headache persists or recurs. 10 tablet 0  . medroxyPROGESTERone (PROVERA) 10 MG tablet Take 1 tablet (10 mg total) by mouth daily. 10 tablet 0  . Multiple Vitamin (MULTIVITAMIN) tablet Take 1 tablet by mouth daily.    Marland Kitchen Ubrogepant (UBRELVY) 50 MG TABS Take 50 mg by mouth daily as needed. 10 tablet 0   No facility-administered medications prior to visit.    No Known Allergies  Review of Systems  Constitutional: Negative for chills, fatigue and fever.  HENT: Negative for congestion, ear pain and sore throat.   Respiratory: Negative for cough and shortness of breath.   Cardiovascular: Negative for chest pain.  Gastrointestinal: Negative for abdominal pain, constipation, diarrhea, nausea and vomiting.  Genitourinary: Negative for dysuria and urgency.  Musculoskeletal: Negative for arthralgias and myalgias.  Skin: Negative for rash.  Neurological: Negative for dizziness and headaches.  Psychiatric/Behavioral: Positive for sleep disturbance. Negative for dysphoric mood. The patient is not nervous/anxious.        Objective:    Physical Exam Vitals reviewed.  Constitutional:  Appearance: Normal appearance.  Cardiovascular:     Rate and Rhythm: Normal rate and regular rhythm.     Heart sounds: Normal heart sounds.  Pulmonary:     Effort: Pulmonary effort is normal.     Breath sounds: Normal breath sounds.  Neurological:     Mental Status: She is alert and oriented to person, place, and time.  Psychiatric:        Mood and Affect: Mood normal.        Behavior: Behavior normal.     BP 104/64   Pulse 88   Temp (!) 97.5 F (36.4 C)   Ht 5' 6"  (1.676 m)   Wt 148 lb (67.1 kg)   BMI 23.89 kg/m  Wt Readings from Last 3 Encounters:  03/02/21 148 lb (67.1 kg)  08/17/20 159 lb 9.6 oz (72.4 kg)   05/09/20 166 lb (75.3 kg)    Health Maintenance Due  Topic Date Due  . HIV Screening  Never done  . Hepatitis C Screening  Never done  . MAMMOGRAM  Never done  . COVID-19 Vaccine (3 - Booster for Pfizer series) 06/26/2020    There are no preventive care reminders to display for this patient.   Lab Results  Component Value Date   TSH 0.428 (L) 03/02/2021   Lab Results  Component Value Date   WBC 5.0 03/02/2021   HGB 13.1 03/02/2021   HCT 39.3 03/02/2021   MCV 93 03/02/2021   PLT 225 03/02/2021   Lab Results  Component Value Date   NA 139 03/08/2021   K 5.5 (H) 03/08/2021   CO2 24 03/08/2021   GLUCOSE 82 03/08/2021   BUN 11 03/08/2021   CREATININE 0.75 03/08/2021   BILITOT 0.3 03/08/2021   ALKPHOS 66 03/08/2021   AST 9 03/08/2021   ALT 12 03/08/2021   PROT 6.7 03/08/2021   ALBUMIN 4.7 03/08/2021   CALCIUM 10.0 03/08/2021   EGFR 96 03/08/2021   Lab Results  Component Value Date   CHOL 219 (H) 03/02/2021   Lab Results  Component Value Date   HDL 68 03/02/2021   Lab Results  Component Value Date   LDLCALC 139 (H) 03/02/2021   Lab Results  Component Value Date   TRIG 68 03/02/2021   Lab Results  Component Value Date   CHOLHDL 3.2 03/02/2021   Lab Results  Component Value Date   HGBA1C 5.3 08/17/2020       Assessment & Plan:  1. Abnormal TSH TSH abnormal but free T4 is within normal limits. - TSH - T4, free  2. Mixed hyperlipidemia Results: Total cholesterol up to 219. LDL elevated 139. Recommend statin medicine: lipitor 10 mg once daily  - CBC with Differential/Platelet - Comprehensive metabolic panel - Lipid panel  3. Symptoms, such as flushing, sleeplessness, headache, lack of concentration, associated with the menopause Start on black cohosh otc. Other treatments for hot flashes include SSRIs (prozac), SNRIs (effexor), bp medicine (clonidine), antiseizure medication (gabapentin.)  No orders of the defined types were placed in this  encounter.   Orders Placed This Encounter  Procedures  . CBC with Differential/Platelet  . Comprehensive metabolic panel  . Lipid panel  . TSH  . T4, free  . Cardiovascular Risk Assessment     Follow-up: Return in about 6 months (around 09/02/2021) for CPE fasting.  An After Visit Summary was printed and given to the patient.  Rochel Brome, MD [facility] [phone]

## 2021-03-02 NOTE — Patient Instructions (Addendum)
Black Cohosh OTC. Other treatments for hot flashes include SSRIs (prozac), SNRIs (effexor), bp medicine (clonidine), antiseizure medication (gabapentin.)  Menopause and Hormone Replacement Therapy Menopause is a normal time of life when menstrual periods stop completely and the ovaries stop producing the female hormones estrogen and progesterone. Low levels of these hormones can affect your health and cause symptoms. Hormone replacement therapy (HRT) can relieve some of those symptoms. HRT is the use of artificial (synthetic) hormones to replace hormones that your body has stopped producing because you have reached menopause. Types of HRT HRT may consist of the synthetic hormones estrogen and progestin, or it may consist of estrogen-only therapy. You and your health care provider will decide which form of HRT is best for you. If you choose to be on HRT and you have a uterus, estrogen and progestin are usually prescribed. Estrogen-only therapy is used for women who do not have a uterus. Possible options for taking HRT include:  Pills.  Patches.  Gels.  Sprays.  Vaginal cream.  Vaginal rings.  Vaginal inserts. The amount of hormones that you take and how long you take them varies according to your health. It is important to:  Begin HRT with the lowest possible dosage.  Stop HRT as soon as your health care provider tells you to stop.  Work with your health care provider so that you feel informed and comfortable with your decisions.   Tell a health care provider about:  Any allergies you have.  Whether you have had blood clots or know of any risk factors you may have for blood clots.  Whether you or family members have had cancer, especially cancer of the breasts, ovaries, or uterus.  Any surgeries you have had.  All medicines you are taking, including vitamins, herbs, eye drops, creams, and over-the-counter medicines.  Whether you are pregnant or may be pregnant.  Any  medical conditions you have. What are the benefits? HRT can reduce the frequency and severity of menopausal symptoms. Benefits of HRT vary according to the kind of symptoms that you have, how severe they are, and your overall health. HRT may help to improve the following symptoms of menopause:  Hot flashes and night sweats. These are sudden feelings of heat that spread over the face and body. The skin may turn red, like a blush. Night sweats are hot flashes that happen while you are sleeping or trying to sleep.  Bone loss (osteoporosis). The body loses calcium more quickly after menopause, causing the bones to become weaker. This can increase the risk for bone breaks (fractures).  Vaginal dryness. The lining of the vagina can become thin and dry, which can cause pain during sex or cause infection, burning, or itching.  Urinary tract infections.  Urinary incontinence. This is the inability to control when you urinate.  Irritability.  Short-term memory problems. What are the risks? Risks of HRT vary depending on your individual health and medical history. Risks of HRT also depend on whether you receive both estrogen and progestin or you receive estrogen only. HRT may increase the risk of:  Spotting. This is when a small amount of blood leaks from the vagina unexpectedly.  Endometrial cancer. This cancer is in the lining of the uterus (endometrium).  Breast cancer.  Increased density of breast tissue. This can make it harder to find breast cancer on a breast X-ray (mammogram).  Stroke.  Heart disease.  Blood clots.  Gallbladder disease or liver disease. Risks of HRT can increase if  you have any of the following conditions:  Endometrial cancer.  Liver disease.  Heart disease.  Breast cancer.  History of blood clots.  History of stroke. Follow these instructions at home: Pap tests  Have Pap tests done as often as told by your health care provider. A Pap test is sometimes  called a Pap smear. It is a screening test that is used to check for signs of cancer of the cervix and vagina. A Pap test can also identify the presence of infection or precancerous changes. Pap tests may be done: ? Every 3 years, starting at age 40. ? Every 5 years, starting after age 59, in combination with testing for human papillomavirus (HPV). ? More often or less often depending on other medical conditions you have, your age, and other risk factors.  It is up to you to get the results of your Pap test. Ask your health care provider, or the department that is doing the test, when your results will be ready. General instructions  Take over-the-counter and prescription medicines only as told by your health care provider.  Do not use any products that contain nicotine or tobacco. These products include cigarettes, chewing tobacco, and vaping devices, such as e-cigarettes. If you need help quitting, ask your health care provider.  Get mammograms, pelvic exams, and medical checkups as often as told by your health care provider.  Keep all follow-up visits. This is important. Contact a health care provider if you have:  Pain or swelling in your legs.  Lumps or changes in your breasts or armpits.  Pain, burning, or bleeding when you urinate.  Unusual vaginal bleeding.  Dizziness or headaches.  Pain in your abdomen. Get help right away if you have:  Shortness of breath.  Chest pain.  Slurred speech.  Weakness or numbness in any part of your arms or legs. These symptoms may represent a serious problem that is an emergency. Do not wait to see if the symptoms will go away. Get medical help right away. Call your local emergency services (911 in the [place] Do not drive yourself to the hospital. Summary  Menopause is a normal time of life when menstrual periods stop completely and the ovaries stop producing the female hormones estrogen and progesterone.  HRT can reduce the frequency  and severity of menopausal symptoms.  Risks of HRT vary depending on your individual health and medical history. This information is not intended to replace advice given to you by your health care provider. Make sure you discuss any questions you have with your health care provider. Document Revised: 04/17/2020 Document Reviewed: 04/17/2020 Elsevier Patient Education  2021 [employer].

## 2021-03-03 LAB — COMPREHENSIVE METABOLIC PANEL
ALT: 11 IU/L (ref 0–32)
AST: 12 IU/L (ref 0–40)
Albumin/Globulin Ratio: 2.5 — ABNORMAL HIGH (ref 1.2–2.2)
Albumin: 4.7 g/dL (ref 3.8–4.9)
Alkaline Phosphatase: 63 IU/L (ref 44–121)
BUN/Creatinine Ratio: 10 (ref 9–23)
BUN: 9 mg/dL (ref 6–24)
Bilirubin Total: 0.3 mg/dL (ref 0.0–1.2)
CO2: 23 mmol/L (ref 20–29)
Calcium: 10.2 mg/dL (ref 8.7–10.2)
Chloride: 105 mmol/L (ref 96–106)
Creatinine, Ser: 0.88 mg/dL (ref 0.57–1.00)
Globulin, Total: 1.9 g/dL (ref 1.5–4.5)
Glucose: 85 mg/dL (ref 65–99)
Potassium: 5.7 mmol/L — ABNORMAL HIGH (ref 3.5–5.2)
Sodium: 142 mmol/L (ref 134–144)
Total Protein: 6.6 g/dL (ref 6.0–8.5)
eGFR: 80 mL/min/{1.73_m2} (ref >59)

## 2021-03-03 LAB — LIPID PANEL
Chol/HDL Ratio: 3.2 ratio (ref 0.0–4.4)
Cholesterol, Total: 219 mg/dL — ABNORMAL HIGH (ref 100–199)
HDL: 68 mg/dL (ref >39)
LDL Chol Calc (NIH): 139 mg/dL — ABNORMAL HIGH (ref 0–99)
Triglycerides: 68 mg/dL (ref 0–149)
VLDL Cholesterol Cal: 12 mg/dL (ref 5–40)

## 2021-03-03 LAB — CBC WITH DIFFERENTIAL/PLATELET
Basophils Absolute: 0.1 10*3/uL (ref 0.0–0.2)
Basos: 1 %
EOS (ABSOLUTE): 0.2 10*3/uL (ref 0.0–0.4)
Eos: 4 %
Hematocrit: 39.3 % (ref 34.0–46.6)
Hemoglobin: 13.1 g/dL (ref 11.1–15.9)
Immature Grans (Abs): 0 10*3/uL (ref 0.0–0.1)
Immature Granulocytes: 0 %
Lymphocytes Absolute: 1.2 10*3/uL (ref 0.7–3.1)
Lymphs: 23 %
MCH: 31 pg (ref 26.6–33.0)
MCHC: 33.3 g/dL (ref 31.5–35.7)
MCV: 93 fL (ref 79–97)
Monocytes Absolute: 0.4 10*3/uL (ref 0.1–0.9)
Monocytes: 7 %
Neutrophils Absolute: 3.2 10*3/uL (ref 1.4–7.0)
Neutrophils: 65 %
Platelets: 225 10*3/uL (ref 150–450)
RBC: 4.22 x10E6/uL (ref 3.77–5.28)
RDW: 11.8 % (ref 11.7–15.4)
WBC: 5 10*3/uL (ref 3.4–10.8)

## 2021-03-03 LAB — T4, FREE: Free T4: 1.27 ng/dL (ref 0.82–1.77)

## 2021-03-03 LAB — CARDIOVASCULAR RISK ASSESSMENT

## 2021-03-03 LAB — TSH: TSH: 0.428 u[IU]/mL — ABNORMAL LOW (ref 0.450–4.500)

## 2021-03-06 ENCOUNTER — Other Ambulatory Visit: Payer: Self-pay

## 2021-03-06 DIAGNOSIS — R799 Abnormal finding of blood chemistry, unspecified: Secondary | ICD-10-CM

## 2021-03-06 MED ORDER — ATORVASTATIN CALCIUM 10 MG PO TABS: 10.0000 mg | ORAL_TABLET | Freq: Every day | ORAL | 0 refills | Status: DC

## 2021-03-08 ENCOUNTER — Other Ambulatory Visit: Payer: Managed Care, Other (non HMO)

## 2021-03-08 ENCOUNTER — Other Ambulatory Visit: Payer: Self-pay

## 2021-03-08 DIAGNOSIS — R799 Abnormal finding of blood chemistry, unspecified: Secondary | ICD-10-CM

## 2021-03-08 LAB — COMPREHENSIVE METABOLIC PANEL
ALT: 12 IU/L (ref 0–32)
AST: 9 IU/L (ref 0–40)
Albumin/Globulin Ratio: 2.4 — ABNORMAL HIGH (ref 1.2–2.2)
Albumin: 4.7 g/dL (ref 3.8–4.9)
Alkaline Phosphatase: 66 IU/L (ref 44–121)
BUN/Creatinine Ratio: 15 (ref 9–23)
BUN: 11 mg/dL (ref 6–24)
Bilirubin Total: 0.3 mg/dL (ref 0.0–1.2)
CO2: 24 mmol/L (ref 20–29)
Calcium: 10 mg/dL (ref 8.7–10.2)
Chloride: 102 mmol/L (ref 96–106)
Creatinine, Ser: 0.75 mg/dL (ref 0.57–1.00)
Globulin, Total: 2 g/dL (ref 1.5–4.5)
Glucose: 82 mg/dL (ref 65–99)
Potassium: 5.5 mmol/L — ABNORMAL HIGH (ref 3.5–5.2)
Sodium: 139 mmol/L (ref 134–144)
Total Protein: 6.7 g/dL (ref 6.0–8.5)
eGFR: 96 mL/min/{1.73_m2} (ref >59)

## 2021-03-11 ENCOUNTER — Encounter: Payer: Self-pay | Admitting: Family Medicine

## 2021-04-02 ENCOUNTER — Other Ambulatory Visit: Payer: Self-pay | Admitting: Family Medicine

## 2021-07-04 ENCOUNTER — Other Ambulatory Visit: Payer: Self-pay | Admitting: Family Medicine

## 2021-07-04 NOTE — Telephone Encounter (Signed)
Refill sent to pharmacy.   

## 2021-08-03 ENCOUNTER — Other Ambulatory Visit: Payer: Self-pay | Admitting: Family Medicine

## 2022-01-15 ENCOUNTER — Other Ambulatory Visit: Payer: Self-pay | Admitting: Family Medicine

## 2022-12-23 DIAGNOSIS — J00 Acute nasopharyngitis [common cold]: Secondary | ICD-10-CM | POA: Diagnosis not present

## 2022-12-23 DIAGNOSIS — R6889 Other general symptoms and signs: Secondary | ICD-10-CM | POA: Diagnosis not present

## 2023-06-25 DIAGNOSIS — Z1231 Encounter for screening mammogram for malignant neoplasm of breast: Secondary | ICD-10-CM | POA: Diagnosis not present

## 2023-06-25 DIAGNOSIS — R928 Other abnormal and inconclusive findings on diagnostic imaging of breast: Secondary | ICD-10-CM | POA: Diagnosis not present

## 2023-07-23 DIAGNOSIS — R92322 Mammographic fibroglandular density, left breast: Secondary | ICD-10-CM | POA: Diagnosis not present

## 2023-07-23 DIAGNOSIS — R928 Other abnormal and inconclusive findings on diagnostic imaging of breast: Secondary | ICD-10-CM | POA: Diagnosis not present

## 2023-07-23 DIAGNOSIS — R921 Mammographic calcification found on diagnostic imaging of breast: Secondary | ICD-10-CM | POA: Diagnosis not present

## 2023-08-11 DIAGNOSIS — N6325 Unspecified lump in the left breast, overlapping quadrants: Secondary | ICD-10-CM | POA: Diagnosis not present

## 2023-08-11 DIAGNOSIS — R928 Other abnormal and inconclusive findings on diagnostic imaging of breast: Secondary | ICD-10-CM | POA: Diagnosis not present

## 2023-08-11 DIAGNOSIS — N6012 Diffuse cystic mastopathy of left breast: Secondary | ICD-10-CM | POA: Diagnosis not present

## 2023-12-30 DIAGNOSIS — R000 Tachycardia, unspecified: Secondary | ICD-10-CM | POA: Diagnosis not present

## 2024-02-29 DIAGNOSIS — Z1212 Encounter for screening for malignant neoplasm of rectum: Secondary | ICD-10-CM | POA: Diagnosis not present

## 2024-02-29 DIAGNOSIS — Z1211 Encounter for screening for malignant neoplasm of colon: Secondary | ICD-10-CM | POA: Diagnosis not present

## 2024-08-16 DIAGNOSIS — R87613 High grade squamous intraepithelial lesion on cytologic smear of cervix (HGSIL): Secondary | ICD-10-CM | POA: Diagnosis not present

## 2024-08-16 DIAGNOSIS — N870 Mild cervical dysplasia: Secondary | ICD-10-CM | POA: Diagnosis not present

## 2024-09-20 DIAGNOSIS — Z1231 Encounter for screening mammogram for malignant neoplasm of breast: Secondary | ICD-10-CM | POA: Diagnosis not present
